# Patient Record
Sex: Female | Born: 1975 | Race: Black or African American | Hispanic: No | Marital: Single | Smoking: Never smoker
Health system: Southern US, Community
[De-identification: ages and names within clinical notes are randomized; demographics above are authoritative.]

## PROBLEM LIST (undated history)

## (undated) HISTORY — PX: ANKLE SURGERY: SHX546

---

## 2013-08-14 ENCOUNTER — Encounter (HOSPITAL_COMMUNITY): Payer: Self-pay | Admitting: Emergency Medicine

## 2013-08-14 ENCOUNTER — Emergency Department (HOSPITAL_COMMUNITY): Payer: Self-pay

## 2013-08-14 ENCOUNTER — Emergency Department (HOSPITAL_COMMUNITY)
Admission: EM | Admit: 2013-08-14 | Discharge: 2013-08-14 | Disposition: A | Discharge status: Home / Self Care | Payer: Self-pay | Attending: Emergency Medicine | Admitting: Emergency Medicine

## 2013-08-14 DIAGNOSIS — R0602 Shortness of breath: Secondary | ICD-10-CM | POA: Insufficient documentation

## 2013-08-14 DIAGNOSIS — R0789 Other chest pain: Secondary | ICD-10-CM

## 2013-08-14 DIAGNOSIS — R079 Chest pain, unspecified: Secondary | ICD-10-CM | POA: Insufficient documentation

## 2013-08-14 LAB — BASIC METABOLIC PANEL
Anion gap: 14 (ref 5–15)
BUN: 8 mg/dL (ref 6–23)
CO2: 20 mEq/L (ref 19–32)
CREATININE: 0.76 mg/dL (ref 0.50–1.10)
Calcium: 8.9 mg/dL (ref 8.4–10.5)
Chloride: 107 mEq/L (ref 96–112)
GFR calc Af Amer: >90 mL/min (ref >90)
GFR calc non Af Amer: >90 mL/min (ref >90)
Glucose, Bld: 116 mg/dL — ABNORMAL HIGH (ref 70–99)
Potassium: 3.4 mEq/L — ABNORMAL LOW (ref 3.7–5.3)
Sodium: 141 mEq/L (ref 137–147)

## 2013-08-14 LAB — CBC
HEMATOCRIT: 30.5 % — AB (ref 36.0–46.0)
Hemoglobin: 9.2 g/dL — ABNORMAL LOW (ref 12.0–15.0)
MCH: 20.6 pg — ABNORMAL LOW (ref 26.0–34.0)
MCHC: 30.2 g/dL (ref 30.0–36.0)
MCV: 68.4 fL — AB (ref 78.0–100.0)
Platelets: 402 10*3/uL — ABNORMAL HIGH (ref 150–400)
RBC: 4.46 MIL/uL (ref 3.87–5.11)
RDW: 17.6 % — ABNORMAL HIGH (ref 11.5–15.5)
WBC: 8.4 10*3/uL (ref 4.0–10.5)

## 2013-08-14 LAB — I-STAT TROPONIN, ED
Troponin i, poc: 0 ng/mL (ref 0.00–0.08)
Troponin i, poc: 0 ng/mL (ref 0.00–0.08)

## 2013-08-14 LAB — HEPATIC FUNCTION PANEL
ALBUMIN: 3.6 g/dL (ref 3.5–5.2)
ALK PHOS: 51 U/L (ref 39–117)
ALT: 9 U/L (ref 0–35)
AST: 14 U/L (ref 0–37)
Bilirubin, Direct: <0.2 mg/dL (ref 0.0–0.3)
TOTAL PROTEIN: 7.1 g/dL (ref 6.0–8.3)
Total Bilirubin: 0.3 mg/dL (ref 0.3–1.2)

## 2013-08-14 LAB — LIPASE, BLOOD: Lipase: 46 U/L (ref 11–59)

## 2013-08-14 MED ORDER — SUCRALFATE 1 G PO TABS: 1.0000 g | ORAL_TABLET | Freq: Four times a day (QID) | ORAL | Status: DC

## 2013-08-14 MED ORDER — FENTANYL CITRATE 0.05 MG/ML IJ SOLN
25.0000 ug | Freq: Once | INTRAMUSCULAR | Status: AC
  Administered 2013-08-14: 25 ug via INTRAVENOUS
  Filled 2013-08-14: qty 2

## 2013-08-14 MED ORDER — SODIUM CHLORIDE 0.9 % IV SOLN
INTRAVENOUS | Status: DC
  Administered 2013-08-14: 19:00:00 via INTRAVENOUS

## 2013-08-14 MED ORDER — OMEPRAZOLE 20 MG PO CPDR: 20.0000 mg | DELAYED_RELEASE_CAPSULE | Freq: Every day | ORAL | Status: DC

## 2013-08-14 MED ORDER — FAMOTIDINE IN NACL 20-0.9 MG/50ML-% IV SOLN
20.0000 mg | Freq: Once | INTRAVENOUS | Status: AC
  Administered 2013-08-14: 20 mg via INTRAVENOUS
  Filled 2013-08-14: qty 50

## 2013-08-14 MED ORDER — LIDOCAINE VISCOUS 2 % MT SOLN
15.0000 mL | Freq: Once | OROMUCOSAL | Status: AC
  Administered 2013-08-14: 15 mL via OROMUCOSAL
  Filled 2013-08-14: qty 15

## 2013-08-14 MED ORDER — GI COCKTAIL ~~LOC~~
30.0000 mL | Freq: Once | ORAL | Status: AC
  Administered 2013-08-14: 30 mL via ORAL
  Filled 2013-08-14: qty 30

## 2013-08-14 NOTE — ED Notes (Signed)
Pt. Reports midsternal chest pain x2 weeks with SOB. Lung sounds clear, patient does not appear to be in distress. Denies N/V, dizziness.

## 2013-08-14 NOTE — ED Notes (Signed)
Pt reports midsternal chest pain for the past 2 weeks. Reports some SOB with the pain. Thinks that she may have PNA

## 2013-08-14 NOTE — Discharge Instructions (Signed)
As discussed, your evaluation today has been largely reassuring.  But, it is important that you monitor your condition carefully, and do not hesitate to return to the ED if you develop new, or concerning changes in your condition.  Your pain is likely due to irritation of the stomach and esophagus.  Otherwise, please follow-up with your physician for appropriate ongoing care.   Chest Pain (Nonspecific) It is often hard to give a diagnosis for the cause of chest pain. There is always a chance that your pain could be related to something serious, such as a heart attack or a blood clot in the lungs. You need to follow up with your doctor. HOME CARE  If antibiotic medicine was given, take it as directed by your doctor. Finish the medicine even if you start to feel better.  For the next few days, avoid activities that bring on chest pain. Continue physical activities as told by your doctor.  Do not use any tobacco products. This includes cigarettes, chewing tobacco, and e-cigarettes.  Avoid drinking alcohol.  Only take medicine as told by your doctor.  Follow your doctor's suggestions for more testing if your chest pain does not go away.  Keep all doctor visits you made. GET HELP IF:  Your chest pain does not go away, even after treatment.  You have a rash with blisters on your chest.  You have a fever. GET HELP RIGHT AWAY IF:   You have more pain or pain that spreads to your arm, neck, jaw, back, or belly (abdomen).  You have shortness of breath.  You cough more than usual or cough up blood.  You have very bad back or belly pain.  You feel sick to your stomach (nauseous) or throw up (vomit).  You have very bad weakness.  You pass out (faint).  You have chills. This is an emergency. Do not wait to see if the problems will go away. Call your local emergency services (911 in [place] Do not drive yourself to the hospital. MAKE SURE YOU:   Understand these  instructions.  Will watch your condition.  Will get help right away if you are not doing well or get worse. Document Released: 07/16/2007 Document Revised: 02/01/2013 Document Reviewed: 07/16/2007 [facility] Patient Information 2015 [employer], [place]. This information is not intended to replace advice given to you by your health care provider. Make sure you discuss any questions you have with your health care provider.

## 2013-08-14 NOTE — ED Provider Notes (Signed)
CSN: [id number]   [id number]  Arrival date & time 08/14/13  1619 History   First MD Initiated Contact with Patient 08/14/13 1805     Chief Complaint  Patient presents with  . Chest Pain     (Consider location/radiation/quality/duration/timing/severity/associated sxs/prior Treatment) HPI Patient presents with weeks of ongoing chest pain. The pain is focally about the epigastrium, with radiation towards the sternum, and occasionally towards the right upper quadrant. No clear precipitant, and since onset no clear alleviating or exacerbating factors.,  And in the pain is sore, severe, with no associated vomiting, diarrhea, anorexia. There is mild associated cough, worse at night. Patient is generally well, does not smoke, does not drink  History reviewed. No pertinent past medical history. Past Surgical History  Procedure Laterality Date  . Ankle surgery     History reviewed. No pertinent family history. History  Substance Use Topics  . Smoking status: Never Smoker   . Smokeless tobacco: Not on file  . Alcohol Use: No   OB History   Grav Para Term Preterm Abortions TAB SAB Ect Mult Living                 Review of Systems  Constitutional:       Per HPI, otherwise negative  HENT:       Per HPI, otherwise negative  Respiratory:       Per HPI, otherwise negative  Cardiovascular:       Per HPI, otherwise negative  Gastrointestinal: Negative for vomiting.  Endocrine:       Negative aside from HPI  Genitourinary:       Neg aside from HPI   Musculoskeletal:       Per HPI, otherwise negative  Skin: Negative.   Neurological: Negative for syncope.      Allergies  Acetaminophen  Home Medications   Prior to Admission medications   Not on File   BP 125/79  Pulse 66  Temp(Src) 98.6 F (37 C) (Oral)  Resp 18  Ht 5\' 2"  (1.575 m)  Wt 210 lb (95.255 kg)  BMI 38.40 kg/m2  SpO2 100%  LMP 08/07/2013 Physical Exam  Nursing note and vitals reviewed. Constitutional: She is  oriented to person, place, and time. She appears well-developed and well-nourished. No distress.  HENT:  Head: Normocephalic and atraumatic.  Eyes: Conjunctivae and EOM are normal.  Cardiovascular: Normal rate and regular rhythm.   Pulmonary/Chest: Effort normal and breath sounds normal. No stridor. No respiratory distress.  Abdominal: She exhibits no distension. There is tenderness. There is no rebound.  Musculoskeletal: She exhibits no edema.  Pain about the epigastrium and sternum with pressure, no guarding, rebound of the abdominal exam.  Neurological: She is alert and oriented to person, place, and time. No cranial nerve deficit.  Skin: Skin is warm and dry.  Psychiatric: She has a normal mood and affect.    ED Course  Procedures (including critical care time) Labs Review Labs Reviewed  CBC - Abnormal; Notable for the following:    Hemoglobin 9.2 (*)    HCT 30.5 (*)    MCV 68.4 (*)    MCH 20.6 (*)    RDW 17.6 (*)    Platelets 402 (*)    All other components within normal limits  BASIC METABOLIC PANEL - Abnormal; Notable for the following:    Potassium 3.4 (*)    Glucose, Bld 116 (*)    All other components within normal limits  HEPATIC FUNCTION PANEL  LIPASE, BLOOD  Rosezena SensorI-STAT TROPOININ, ED    Imaging Review Dg Chest 2 View  08/14/2013   CLINICAL DATA:  Substernal chest pain and pressure for the past 2 weeks. Cough and chest congestion.  EXAM: CHEST  2 VIEW  COMPARISON:  None.  FINDINGS: Normal sized heart.  Clear lungs.  Normal appearing bones.  IMPRESSION: Normal examination.   Electronically Signed   By: Gordan PaymentSteve  Reid M.D.   On: 08/14/2013 17:26     EKG Interpretation   Date/Time:  Sunday August 14 2013 16:30:40 EDT Ventricular Rate:  85 PR Interval:  154 QRS Duration: 84 QT Interval:  378 QTC Calculation: 449 R Axis:   42 Text Interpretation:  Normal sinus rhythm Normal ECG Sinus rhythm Artifact  Borderline ECG Confirmed by Gerhard MunchLOCKWOOD, Talicia Sui  MD ([id number]) on 08/14/2013   6:12:57 PM     On re-exam the patient is awake and alert - no distress.   Second troponin is normal.  MDM   Patient presents with weeks of ongoing epigastric and chest pain.  Exam patient is awake alert, afebrile, with no hypoxia, tachypnea, tachycardia suggestive of thromboembolic processes are occult infection. Patient has 2 negative troponins, a nonischemic EKG, and given her improvement with GI cocktail, was started on a medication regimen for likely gastroesophageal origin.   Gerhard Munchobert Hiep Ollis, MD 08/14/13 2225

## 2014-07-23 ENCOUNTER — Encounter (HOSPITAL_COMMUNITY): Payer: Self-pay

## 2014-07-23 ENCOUNTER — Emergency Department (HOSPITAL_COMMUNITY)
Admission: EM | Admit: 2014-07-23 | Discharge: 2014-07-23 | Disposition: A | Discharge status: Home / Self Care | Payer: PRIVATE HEALTH INSURANCE | Attending: Emergency Medicine | Admitting: Emergency Medicine

## 2014-07-23 DIAGNOSIS — Z3202 Encounter for pregnancy test, result negative: Secondary | ICD-10-CM | POA: Diagnosis not present

## 2014-07-23 DIAGNOSIS — M62838 Other muscle spasm: Secondary | ICD-10-CM | POA: Diagnosis not present

## 2014-07-23 DIAGNOSIS — R109 Unspecified abdominal pain: Secondary | ICD-10-CM | POA: Insufficient documentation

## 2014-07-23 DIAGNOSIS — M545 Low back pain, unspecified: Secondary | ICD-10-CM

## 2014-07-23 LAB — URINALYSIS, ROUTINE W REFLEX MICROSCOPIC
Bilirubin Urine: NEGATIVE
Glucose, UA: NEGATIVE mg/dL
HGB URINE DIPSTICK: NEGATIVE
Ketones, ur: NEGATIVE mg/dL
LEUKOCYTES UA: NEGATIVE
Nitrite: NEGATIVE
PH: 6 (ref 5.0–8.0)
Protein, ur: NEGATIVE mg/dL
Specific Gravity, Urine: 1.024 (ref 1.005–1.030)
Urobilinogen, UA: 0.2 mg/dL (ref 0.0–1.0)

## 2014-07-23 LAB — POC URINE PREG, ED: PREG TEST UR: NEGATIVE

## 2014-07-23 MED ORDER — CYCLOBENZAPRINE HCL 10 MG PO TABS: 10.0000 mg | ORAL_TABLET | Freq: Two times a day (BID) | ORAL | Status: DC | PRN

## 2014-07-23 MED ORDER — OXYCODONE HCL 5 MG PO TABS: 5.0000 mg | ORAL_TABLET | Freq: Four times a day (QID) | ORAL | Status: DC | PRN

## 2014-07-23 MED ORDER — OXYCODONE HCL 5 MG PO TABS
5.0000 mg | ORAL_TABLET | Freq: Once | ORAL | Status: AC
  Administered 2014-07-23: 5 mg via ORAL
  Filled 2014-07-23: qty 1

## 2014-07-23 NOTE — ED Notes (Signed)
PA at bedside.

## 2014-07-23 NOTE — ED Notes (Addendum)
Pt reports 07-14-14 pt lifting boxes at work and pulled lower back. Pt has been seen several times and prescribed pain and muscle relaxers with no relief.  Pt reports now have right side abdominal pain.

## 2014-07-23 NOTE — ED Provider Notes (Signed)
CSN: [id number]     Arrival date & time 07/23/14  1218 History   First MD Initiated Contact with Patient 07/23/14 1448     Chief Complaint  Patient presents with  . Back Pain  . Abdominal Pain     (Consider location/radiation/quality/duration/timing/severity/associated sxs/prior Treatment) The history is provided by the patient and medical records. No language interpreter was used.     Kathleen Welch is a 39 y.o. female  with no major medical problems presents to the Emergency Department complaining of gradual, persistent, progressively worsening low back pain after lifting something heavy at work on 07/14/2014. Patient reports she has seen urgent care twice and was given Robaxin without relief. She reports she had x-rays taken there of her low back which showed no acute abnormalities. She reports that she is tired of hurting. She states she has continued to go to work and lift heavy things. She is additionally taking ibuprofen with moderate relief. She denies any numbness, tingling, weakness, gait disturbance, loss of bowel or bladder control, saddle anesthesia. Patient denies history of cancer, IVDU or drug use or anticoagulates.  Nursing reports patient complaining of right-sided abdominal pain. When questioned about this she reports that sometimes when she moves quickly the pain from her right lower back wraps around to her right side. She denies overt abdominal pain, nausea, vomiting, diarrhea, dysuria, hematuria, vaginal discharge, fever, chills.   History reviewed. No pertinent past medical history. Past Surgical History  Procedure Laterality Date  . Ankle surgery     History reviewed. No pertinent family history. History  Substance Use Topics  . Smoking status: Never Smoker   . Smokeless tobacco: Not on file  . Alcohol Use: No   OB History    No data available     Review of Systems  Constitutional: Negative for fever, diaphoresis, appetite change, fatigue and unexpected  weight change.  HENT: Negative for mouth sores.   Eyes: Negative for visual disturbance.  Respiratory: Negative for cough, chest tightness, shortness of breath and wheezing.   Cardiovascular: Negative for chest pain.  Gastrointestinal: Negative for nausea, vomiting, abdominal pain, diarrhea and constipation.  Endocrine: Negative for polydipsia, polyphagia and polyuria.  Genitourinary: Negative for dysuria, urgency, frequency and hematuria.  Musculoskeletal: Positive for back pain. Negative for neck stiffness.  Skin: Negative for rash.  Allergic/Immunologic: Negative for immunocompromised state.  Neurological: Negative for syncope, light-headedness and headaches.  Hematological: Does not bruise/bleed easily.  Psychiatric/Behavioral: Negative for sleep disturbance. The patient is not nervous/anxious.       Allergies  Acetaminophen and Coconut oil  Home Medications   Prior to Admission medications   Medication Sig Start Date End Date Taking? Authorizing Provider  amoxicillin-clavulanate (AUGMENTIN) 875-125 MG per tablet Take 1 tablet by mouth 2 (two) times daily.  07/16/14 07/26/14 Yes Historical Provider, MD  ibuprofen (ADVIL,MOTRIN) 800 MG tablet Take 800 mg by mouth every 8 (eight) hours as needed for moderate pain.   Yes Historical Provider, MD  methocarbamol (ROBAXIN) 500 MG tablet Take 500 mg by mouth daily as needed for muscle spasms.   Yes Historical Provider, MD  cyclobenzaprine (FLEXERIL) 10 MG tablet Take 1 tablet (10 mg total) by mouth 2 (two) times daily as needed for muscle spasms. 07/23/14   Sarin Comunale, PA-C  omeprazole (PRILOSEC) 20 MG capsule Take 1 capsule (20 mg total) by mouth daily. Patient not taking: Reported on 07/23/2014 08/14/13   Gerhard Munch, MD  oxyCODONE (ROXICODONE) 5 MG immediate release tablet Take 1 tablet (  5 mg total) by mouth every 6 (six) hours as needed for severe pain. 07/23/14   Johnie Makki, PA-C  sucralfate (CARAFATE) 1 G tablet Take 1  tablet (1 g total) by mouth 4 (four) times daily. Patient not taking: Reported on 07/23/2014 08/14/13   Gerhard Munch, MD   BP 110/70 mmHg  Pulse 62  Temp(Src) 98.2 F (36.8 C) (Oral)  Resp 16  Ht  (1.575 m)  Wt 222 lb 11.2 oz (101.016 kg)  BMI 40.72 kg/m2  SpO2 100%  LMP 07/16/2014 Physical Exam  Constitutional: She appears well-developed and well-nourished. No distress.  Awake, alert, nontoxic appearance  HENT:  Head: Normocephalic and atraumatic.  Mouth/Throat: Oropharynx is clear and moist. No oropharyngeal exudate.  Eyes: Conjunctivae are normal. No scleral icterus.  Neck: Normal range of motion. Neck supple.  Full ROM without pain  Cardiovascular: Normal rate, regular rhythm, normal heart sounds and intact distal pulses.   Pulmonary/Chest: Effort normal and breath sounds normal. No respiratory distress. She has no wheezes.  Equal chest expansion  Abdominal: Soft. Bowel sounds are normal. She exhibits no distension and no mass. There is no tenderness. There is no rebound and no guarding.  Soft and nontender No CVA tenderness  Musculoskeletal: Normal range of motion. She exhibits no edema.  Full range of motion of the T-spine and L-spine No tenderness to palpation of the spinous processes of the T-spine or L-spine Mild tenderness to palpation of the right paraspinous muscles of the L-spine Palpable muscle spasm of left trapezius with full ROM of all major joints  Lymphadenopathy:    She has no cervical adenopathy.  Neurological: She is alert. She has normal reflexes.  Reflex Scores:      Bicep reflexes are 2+ on the right side and 2+ on the left side.      Brachioradialis reflexes are 2+ on the right side and 2+ on the left side.      Patellar reflexes are 2+ on the right side and 2+ on the left side.      Achilles reflexes are 2+ on the right side and 2+ on the left side. Speech is clear and goal oriented, follows commands Normal 5/5 strength in upper and lower  extremities bilaterally including dorsiflexion and plantar flexion, strong and equal grip strength Sensation normal to light and sharp touch Moves extremities without ataxia, coordination intact Normal gait Normal balance No Clonus   Skin: Skin is warm and dry. No rash noted. She is not diaphoretic. No erythema.  Psychiatric: She has a normal mood and affect. Her behavior is normal.  Nursing note and vitals reviewed.   ED Course  Procedures (including critical care time) Labs Review Labs Reviewed  URINALYSIS, ROUTINE W REFLEX MICROSCOPIC (NOT AT [facility]) - Abnormal; Notable for the following:    APPearance CLOUDY (*)    All other components within normal limits  POC URINE PREG, ED    Imaging Review No results found.   EKG Interpretation None      MDM   Final diagnoses:  Right-sided low back pain without sciatica  Trapezius muscle spasm   Kathleen Welch presents with c/o low back pain and left trapezius pain after straining her back.  Pt isn't no apparent distress and moves easily around the room during exam and spontaneously.  Abdomen soft and nontender.  Due to history and physical highly doubt appendicitis, bowel obstruction, bowel perforation, cholecystitis, diverticulitis, PID or ectopic pregnancy.    UA without Hematuria to suggest  nephrolithiasis. She has no CVA tenderness.  No evidence of urinary tract infection on UA.  No neurological deficits and normal neuro exam.  Patient can walk with ease.  No loss of bowel or bladder control.  No concern for cauda equina.  No fever, night sweats, weight loss, h/o cancer, IVDU.  RICE protocol and pain medicine indicated and discussed with patient. Patient is to work on light duty for the next week.     BP 110/70 mmHg  Pulse 62  Temp(Src) 98.2 F (36.8 C) (Oral)  Resp 16  Ht 5\' 2"  (1.575 m)  Wt 222 lb 11.2 oz (101.016 kg)  BMI 40.72 kg/m2  SpO2 100%  LMP 07/16/2014      Dierdre Forth, PA-C 07/23/14  1645  Gwyneth Sprout, MD 07/27/14 [phone]

## 2014-07-23 NOTE — Discharge Instructions (Signed)
1. Medications: flexeril, ibuprofen, vicodin, usual home medications 2. Treatment: rest, drink plenty of fluids, gentle stretching as discussed, alternate ice and heat 3. Follow Up: Please followup with your primary doctor in 3 days for discussion of your diagnoses and further evaluation after today's visit; if you do not have a primary care doctor use the resource guide provided to find one;  Return to the ER for worsening back pain, difficulty walking, loss of bowel or bladder control or other concerning symptoms     Back Exercises Back exercises help treat and prevent back injuries. The goal of back exercises is to increase the strength of your abdominal and back muscles and the flexibility of your back. These exercises should be started when you no longer have back pain. Back exercises include:  Pelvic Tilt. Lie on your back with your knees bent. Tilt your pelvis until the lower part of your back is against the floor. Hold this position 5 to 10 sec and repeat 5 to 10 times.  Knee to Chest. Pull first 1 knee up against your chest and hold for 20 to 30 seconds, repeat this with the other knee, and then both knees. This may be done with the other leg straight or bent, whichever feels better.  Sit-Ups or Curl-Ups. Bend your knees 90 degrees. Start with tilting your pelvis, and do a partial, slow sit-up, lifting your trunk only 30 to 45 degrees off the floor. Take at least 2 to 3 seconds for each sit-up. Do not do sit-ups with your knees out straight. If partial sit-ups are difficult, simply do the above but with only tightening your abdominal muscles and holding it as directed.  Hip-Lift. Lie on your back with your knees flexed 90 degrees. Push down with your feet and shoulders as you raise your hips a couple inches off the floor; hold for 10 seconds, repeat 5 to 10 times.  Back arches. Lie on your stomach, propping yourself up on bent elbows. Slowly press on your hands, causing an arch in your low  back. Repeat 3 to 5 times. Any initial stiffness and discomfort should lessen with repetition over time.  Shoulder-Lifts. Lie face down with arms beside your body. Keep hips and torso pressed to floor as you slowly lift your head and shoulders off the floor. Do not overdo your exercises, especially in the beginning. Exercises may cause you some mild back discomfort which lasts for a few minutes; however, if the pain is more severe, or lasts for more than 15 minutes, do not continue exercises until you see your caregiver. Improvement with exercise therapy for back problems is slow.  See your caregivers for assistance with developing a proper back exercise program. Document Released: 03/06/2004 Document Revised: 04/21/2011 Document Reviewed: 11/28/2010 [facility] Patient Information 2015 [place], [place]. This information is not intended to replace advice given to you by your health care provider. Make sure you discuss any questions you have with your health care provider.    Emergency Department Resource Guide 1) Find a Doctor and Pay Out of Pocket Although you won't have to find out who is covered by your insurance plan, it is a good idea to ask around and get recommendations. You will then need to call the office and see if the doctor you have chosen will accept you as a new patient and what types of options they offer for patients who are self-pay. Some doctors offer discounts or will set up payment plans for their patients who do not  have insurance, but you will need to ask so you aren't surprised when you get to your appointment. ° °2) Contact Your Local Health Department °Not all health departments have doctors that can see patients for sick visits, but many do, so it is worth a call to see if yours does. If you don't know where your local health department is, you can check in your phone book. The CDC also has a tool to help you locate your state's health department, and many state websites also have  listings of all of their local health departments. ° °3) Find a Walk-in Clinic °If your illness is not likely to be very severe or complicated, you may want to try a walk in clinic. These are popping up all over the country in pharmacies, drugstores, and shopping centers. They're usually staffed by nurse practitioners or physician assistants that have been trained to treat common illnesses and complaints. They're usually fairly quick and inexpensive. However, if you have serious medical issues or chronic medical problems, these are probably not your best option. ° °No Primary Care Doctor: °- Call Health Connect at  832-8000 - they can help you locate a primary care doctor that  accepts your insurance, provides certain services, etc. °- Physician Referral Service- 1-800-533-3463 ° °Chronic Pain Problems: °Organization         Address  Phone   Notes  °[facility]  (336) 297-2271 Patients need to be referred by their primary care doctor.  ° °Medication Assistance: °Organization         Address  Phone   Notes  °[place] [facility] [address] °[place], [place] [address] (336) 641-8030 --Must be a resident of [place] °-- Must have NO insurance coverage whatsoever (no Medicaid/ Medicare, etc.) °-- The pt. MUST have a primary care doctor that directs their care regularly and follows them in the community °  °[facility]  (866) 331-1348   °[employer]  (888) 892-1162   ° °Agencies that provide inexpensive medical care: °Organization         Address  Phone   Notes  °[facility]  (336) 832-8035   °[facility]    (336) 832-7272   °[facility] [address] °[place], [place] [address] (336) 832-4777   °[facility] [address], °[place] (336) 271-4999   °[facility]    (336) 373-0678   °[facility]    (336) 272-1050   °[facility] ° [address], [place] Phone:  (336) 832-4444, Fax:  (336) 832-4440 Hours of Operation:  9 am - 6 pm, M-F.  Also accepts Medicaid/Medicare and self-pay.  °[facility] ° [address], [place] Phone: (336) 832-3150, Fax: (336) 832-3151. Hours of Operation:  8:30 am - 5:30 pm, M-F.  Also accepts Medicaid and self-pay.  °[facility] [place] [address], [place] Phone: (336) 878-6027   °[facility] [address], [place], [place] (336)723-1848, Ext. 123 Mondays & Thursdays: 7-9 AM.  First 15 patients are seen on a first come, first serve basis. °  ° °Medicaid-accepting [place] Providers: ° °Organization         Address  Phone   Notes  °[facility] [address], [place] (336) 641-2100 Also accepts self-pay patients.  °[facility] [address], [place] ° (336) 856-9996   °[facility] [address], [place] (336) 288-8857   °[facility] [address] [place] [address], [place] (336) 299-7000   °Veita Bland [address], [place]  ° (336) 373-1557 Only accepts [place] Access Medicaid patients after they have their name applied to their card.  ° °Self-Pay (no insurance) in [place]: ° °Organization         Address  Phone   Notes  °Sickle Cell Patients, [facility] [address], [place] (336) 832-1970   °[facility] [address], [place] (336) 832-4400   ° [facility] ° [address], [place] (336) 992-4800   °[facility]/Dr. Osei-Bonsu ° [address] [place] [address], [place] or [address], [place] (336) 841-8500 Phone number for both [place] and [place] locations is the same.  °[facility] [address], [place] (336) 299-0000   °[facility] [address] [place] [address],  [place] or [address] (336) 852-7530 °(336) 878-2260   °[facility] [address], [place] (336) 350-1642, phone; (336) 294-5005, fax Sees patients 1st and 3rd Saturday of every month.  Must not qualify for public or private insurance (i.e. Medicaid, Medicare, [place] Health Choice, Veterans' Benefits) • Household income should be no more than 200% of the poverty level •The clinic cannot treat you if you are pregnant or think you are pregnant • Sexually transmitted diseases are not treated at the clinic.  ° ° °Dental Care: °Organization         Address  Phone  Notes  °[place] [facility] [address],  (336) 641-6152 Accepts children up to age 21 who are enrolled in Medicaid or [place] Health Choice; pregnant women with a Medicaid card; and children who have applied for Medicaid or [place] Health Choice, but were declined, whose parents can pay a reduced fee at time of service.  °[place] [place]  [address], [place] (336) 641-7733 Accepts children up to age 21 who are enrolled in Medicaid or [place] Health Choice; pregnant women with a Medicaid card; and children who have applied for Medicaid or  Health Choice, but were declined, whose parents can pay a reduced fee at time of service.  °[facility] ° [address],  (336) 641-4533 Patients are seen by appointment only. Walk-ins are not accepted. [facility] will see patients 18 years of age and older. °Monday - Tuesday (8am-5pm) °Most Wednesdays (8:30-5pm) °$30 per visit, cash only  °[facility] ° [address], [place] (336) 641-4533 Patients are seen by appointment only. Walk-ins are not accepted. [facility] will see patients 18 years of age and older. °One Wednesday Evening (Monthly: Volunteer Based).  $30 per visit, cash only  °[facility]  (919) 537-3737 for adults; Children under age 4, call Graduate Pediatric Dentistry at (919) [phone]. Children aged 4-14, please call (919) [phone] to request a pediatric application. ° Dental services are provided in all areas of dental care including fillings, crowns and bridges, complete and partial dentures, implants, gum treatment, root canals, and extractions. Preventive care is also provided. Treatment is provided to both adults and children. °Patients are selected   via a lottery and there is often a waiting list.   [facility] [address], [place]  [phone] [email or web address]   [facility] [address] [place], [place] (706) [phone], Ext. 123 Second and Fourth Thursday of each month, opens at 6:30 AM; Clinic ends at 9 AM.  Patients are seen on a first-come first-served basis, and a limited number are seen during each clinic.   [facility]  [address] [place], [place] [phone]   Eligibility Requirements You must have lived in [place], [place], or [place] counties for at least the last three months.   You cannot be eligible for state or federal sponsored [employer], including [employer], [place], or [employer].   You generally cannot be eligible for healthcare insurance through your employer.    How to apply: Eligibility screenings are held every Tuesday and Wednesday afternoon from 1:00 pm until 4:00 pm. You do not need an appointment for the interview!  [facility] [address], [place], [place]   [place]  [place] Department  [place]  (343) [phone]    Behavioral Health Resources in the Community: Intensive Outpatient Programs Organization         Address  Phone  Notes  [facility] [address] [place], [place] (717) 669-0420     [facility] Outpatient [address] [place], [place]   [facility] [address] [place], [place]   [place] [address],  [place], [place] or 629-047-7133   Substance Abuse Resources Organization         Address  Phone  Notes  [facility]  919-180-0944   [place]  (504)412-7821   [facility]   [place]  608-128-5017   Residential & Outpatient Substance Abuse Program  229-661-3722   Psychological Services Organization         Address  Phone  Notes  [facility] [place]  [place]  339-016-8701   [place] [address], [place] or (640) 116-7793    Mobile Crisis Teams Organization         Address  Phone  Notes  [facility], Mobile Crisis Care Unit  (913)583-5610   [facility]  [address] [place], [place]   [place] [address], [place] 951-216-5316    Self-Help/Support Groups Organization         Address  Phone             Notes  [place]. of [place] - variety of support groups  [place] Call for more information  Narcotics Anonymous (NA), [facility] [address], [employer] [place]  2 meetings at this location   Special educational needs teacher         Address  Phone  Notes  [facility] [place],    [place]  1-(843)591-0072   [facility]  [address] [place] [address], [place], [place]   [place], [place] [phone] Admissions: 8am-3pm M-F  [facility] [place] [address],    [place], [place]   [address], [place], [place] 336-379-7146   °[facility] [address]  °[place],  [place] 336-285-9073   °[facility] Intensive Outpatient [address], [place], [place] 336-852-3033   °[facility] [address]  °[place], [place] 1-877-615-2722 or 336-784-9470   °[facility] [address] [place], [place] 336-227-7417 Accepts Medicaid  °[facility] [address]  ° [place] 1-800-659-3381 Substance Abuse/Addiction Treatment  ° °[place] Behavioral Health Resources °Organization         Address  Phone  Notes  °[facility]  (888) 581-9988   °Julie Brannon, PhD [address] [place], [place]   (336) 349-5553 or (336) 951-0000   °[facility]   [address] °[place], [place] (336) 349-4454   °[facility] [address], [place], [place] (336) 342-8316 Insurance/Medicaid/sponsorship through [facility]  °[facility] [address]                                    [place], [place] (336) 342-8316 Therapy/tele-psych/case  °[facility] [address]  ° [place], [place] (336) 349-2233    °Dr. Arfeen  (336) 349-4544   °[facility] [place]  [employer] [facility] [place] [facility] 1) [address], [place] °2) [address], [place] °3)  [address], [place] (336) 349-3220 °(336) 342-7768 ° °(336) 342-8140   °[place] Child Abuse Hotline (336) 342-1394 or (336) 342-3537 (After Hours)    ° ° ° °

## 2014-11-21 ENCOUNTER — Encounter (HOSPITAL_COMMUNITY): Payer: Self-pay | Admitting: *Deleted

## 2014-11-21 ENCOUNTER — Emergency Department (HOSPITAL_COMMUNITY): Payer: Medicaid Other

## 2014-11-21 ENCOUNTER — Emergency Department (HOSPITAL_COMMUNITY)
Admission: EM | Admit: 2014-11-21 | Discharge: 2014-11-21 | Disposition: A | Discharge status: Home / Self Care | Payer: Medicaid Other | Attending: Emergency Medicine | Admitting: Emergency Medicine

## 2014-11-21 DIAGNOSIS — R0789 Other chest pain: Secondary | ICD-10-CM | POA: Insufficient documentation

## 2014-11-21 DIAGNOSIS — Z791 Long term (current) use of non-steroidal anti-inflammatories (NSAID): Secondary | ICD-10-CM | POA: Diagnosis not present

## 2014-11-21 DIAGNOSIS — R0602 Shortness of breath: Secondary | ICD-10-CM | POA: Diagnosis not present

## 2014-11-21 DIAGNOSIS — R000 Tachycardia, unspecified: Secondary | ICD-10-CM | POA: Insufficient documentation

## 2014-11-21 DIAGNOSIS — R079 Chest pain, unspecified: Secondary | ICD-10-CM | POA: Diagnosis present

## 2014-11-21 LAB — BASIC METABOLIC PANEL
ANION GAP: 8 (ref 5–15)
BUN: 8 mg/dL (ref 6–20)
CALCIUM: 9.2 mg/dL (ref 8.9–10.3)
CO2: 21 mmol/L — ABNORMAL LOW (ref 22–32)
Chloride: 109 mmol/L (ref 101–111)
Creatinine, Ser: 0.8 mg/dL (ref 0.44–1.00)
GFR calc Af Amer: >60 mL/min (ref >60)
Glucose, Bld: 94 mg/dL (ref 65–99)
POTASSIUM: 3.6 mmol/L (ref 3.5–5.1)
SODIUM: 138 mmol/L (ref 135–145)

## 2014-11-21 LAB — CBC
HEMATOCRIT: 31 % — AB (ref 36.0–46.0)
HEMOGLOBIN: 9.2 g/dL — AB (ref 12.0–15.0)
MCH: 18.9 pg — ABNORMAL LOW (ref 26.0–34.0)
MCHC: 29.7 g/dL — AB (ref 30.0–36.0)
MCV: 63.7 fL — ABNORMAL LOW (ref 78.0–100.0)
Platelets: 357 10*3/uL (ref 150–400)
RBC: 4.87 MIL/uL (ref 3.87–5.11)
RDW: 18.8 % — AB (ref 11.5–15.5)
WBC: 7.3 10*3/uL (ref 4.0–10.5)

## 2014-11-21 LAB — I-STAT TROPONIN, ED: TROPONIN I, POC: 0 ng/mL (ref 0.00–0.08)

## 2014-11-21 MED ORDER — INDOMETHACIN 50 MG PO CAPS: 50.0000 mg | ORAL_CAPSULE | Freq: Two times a day (BID) | ORAL | Status: DC

## 2014-11-21 NOTE — ED Notes (Signed)
Pt upset at this time, states that "nobody cares and everyone keeps saying nothing is wrong when there is," this RN apologized that pt feels upset and offerred to write down cardiologist name and address from [employer]. Pt still upset as she ambulates out to the waiting room.

## 2014-11-21 NOTE — ED Notes (Addendum)
Pt came up to window infuriated that it is taking so long to be taken back to a room. I informed patient that we are trying our best to get her back to a room. Pt stated that her chest pain "doesn't mean shit".

## 2014-11-21 NOTE — ED Notes (Signed)
Pt is here with chest pain and hard to take deep and lump to chest.  Pt states this has been going on for 4 months.  Pt reports diarrhea for 4 days

## 2014-11-21 NOTE — ED Notes (Signed)
Pt came up infuriated that

## 2014-11-21 NOTE — ED Notes (Signed)
Pt placed on 5 lead cardiac monitoring. NSR w/ occasional pvc.

## 2014-11-21 NOTE — ED Provider Notes (Signed)
CSN: [id number]     Arrival date & time 11/21/14  1741 History  By signing my name below, I, Kathleen Welch, attest that this documentation has been prepared under the direction and in the presence of Felicie Morn, NP. Electronically Signed: Angelene Giovanni, ED Scribe. 11/21/2014. 10:50 PM.    Chief Complaint  Patient presents with  . Chest Pain   Patient is a 39 y.o. female presenting with chest pain. The history is provided by the patient. No language interpreter was used.  Chest Pain Pain location:  Substernal area Pain quality: sharp   Pain radiates to:  Does not radiate Pain radiates to the back: no   Pain severity:  Moderate Onset quality:  Sudden Duration: a year. Timing:  Intermittent Progression:  Unchanged Chronicity:  New Relieved by:  Nothing Worsened by:  Certain positions Ineffective treatments:  Antacids Associated symptoms: shortness of breath   Associated symptoms: no abdominal pain, no nausea and not vomiting   Risk factors: no birth control and no smoking    HPI Comments: Kathleen Welch is a 39 y.o. female who presents to the Emergency Department complaining of intermittent sudden onset of sharp substernal CP onset one year ago. She reports associated SOB and tachycardia only with the pain. She denies any abdominal pain, N/V/D, or heart burn. She states that she has been using Motrin with no relief. She denies that she is a current smoker or currently on birth control. She states that she has been in the hospital many times and been told that it is GERD but she states that she is not satisfied with that diagnosis. She reports a family hx of heart issues in her father.   PCP: [facility]. She states that she has not been able to follow up with PCP with this issue.   History reviewed. No pertinent past medical history. Past Surgical History  Procedure Laterality Date  . Ankle surgery     No family history on file. Social History   Substance Use Topics  . Smoking status: Never Smoker   . Smokeless tobacco: None  . Alcohol Use: No   OB History    No data available     Review of Systems  Respiratory: Positive for shortness of breath.   Cardiovascular: Positive for chest pain.  Gastrointestinal: Negative for nausea, vomiting, abdominal pain and diarrhea.  All other systems reviewed and are negative.     Allergies  Acetaminophen and Coconut oil  Home Medications   Prior to Admission medications   Medication Sig Start Date End Date Taking? Authorizing Provider  cyclobenzaprine (FLEXERIL) 10 MG tablet Take 1 tablet (10 mg total) by mouth 2 (two) times daily as needed for muscle spasms. 07/23/14   Hannah Muthersbaugh, PA-C  ibuprofen (ADVIL,MOTRIN) 800 MG tablet Take 800 mg by mouth every 8 (eight) hours as needed for moderate pain.    Historical Provider, MD  indomethacin (INDOCIN) 50 MG capsule Take 1 capsule (50 mg total) by mouth 2 (two) times daily with a meal. 11/21/14   Felicie Morn, NP  methocarbamol (ROBAXIN) 500 MG tablet Take 500 mg by mouth daily as needed for muscle spasms.    Historical Provider, MD  omeprazole (PRILOSEC) 20 MG capsule Take 1 capsule (20 mg total) by mouth daily. Patient not taking: Reported on 07/23/2014 08/14/13   Gerhard Munch, MD  oxyCODONE (ROXICODONE) 5 MG immediate release tablet Take 1 tablet (5 mg total) by mouth every 6 (six) hours as needed for  severe pain. 07/23/14   Hannah Muthersbaugh, PA-C  sucralfate (CARAFATE) 1 G tablet Take 1 tablet (1 g total) by mouth 4 (four) times daily. Patient not taking: Reported on 07/23/2014 08/14/13   Gerhard Munch, MD   BP 135/89 mmHg  Pulse 82  Temp(Src) 98.8 F (37.1 C) (Oral)  Resp 18  SpO2 100%  LMP 11/04/2014 Physical Exam  Constitutional: She is oriented to person, place, and time. She appears well-developed and well-nourished.  HENT:  Head: Normocephalic and atraumatic.  Cardiovascular: Normal rate.   Pulmonary/Chest:  Effort normal. She exhibits tenderness.  Reproducible chest wall tenderness in upper part of sternal into right chest wall   Abdominal: She exhibits no distension.  Neurological: She is alert and oriented to person, place, and time.  Skin: Skin is warm and dry.  Psychiatric: She has a normal mood and affect.  Nursing note and vitals reviewed.   ED Course  Procedures (including critical care time) DIAGNOSTIC STUDIES: Oxygen Saturation is 100% on RA, normal by my interpretation.    COORDINATION OF CARE: 10:44 PM- Pt advised of plan for treatment and pt agrees. Pt advised to see PCP about these symptoms for long term care.    Labs Review Labs Reviewed  BASIC METABOLIC PANEL - Abnormal; Notable for the following:    CO2 21 (*)    All other components within normal limits  CBC - Abnormal; Notable for the following:    Hemoglobin 9.2 (*)    HCT 31.0 (*)    MCV 63.7 (*)    MCH 18.9 (*)    MCHC 29.7 (*)    RDW 18.8 (*)    All other components within normal limits  I-STAT TROPOININ, ED  POC URINE PREG, ED    Imaging Review Dg Chest 2 View  11/21/2014   CLINICAL DATA:  Chest pain for 4 months  EXAM: CHEST - 2 VIEW  COMPARISON:  08/14/2013  FINDINGS: The heart size and mediastinal contours are within normal limits. Both lungs are clear. The visualized skeletal structures are unremarkable.  IMPRESSION: No active disease.   Electronically Signed   By: Alcide Clever M.D.   On: 11/21/2014 18:36   Felicie Morn, NP has personally reviewed and evaluated these images and lab results as part of his medical decision-making.   EKG Interpretation None     Patient with chronic, recurrent anterior chest wall pain. Chest wall tenderness to palpation upper chest wall to right of sternum. Multiple evaluations--does not appear to be cardiac or pulmonary in origin. Lab and radiology results reviewed. Chronic, stable anemia. Normal troponin. Normal CXR. ECG without indication of ischemia. No current  tachypnea, tachycardia, fever. Non-smoker and not on birth control. Doubt PE.  Will trial indocin for pain. Patient urged to follow-up with her PCP. Return precautions discussed. MDM   Final diagnoses:  Anterior chest wall pain    I personally performed the services described in this documentation, which was scribed in my presence. The recorded information has been reviewed and is accurate.     Felicie Morn, NP 11/22/14 1001  Blake Divine, MD 11/23/14 [phone]

## 2014-11-21 NOTE — Discharge Instructions (Signed)

## 2015-03-23 ENCOUNTER — Emergency Department (HOSPITAL_COMMUNITY)
Admission: EM | Admit: 2015-03-23 | Discharge: 2015-03-23 | Disposition: A | Discharge status: Home / Self Care | Payer: Medicaid Other | Attending: Emergency Medicine | Admitting: Emergency Medicine

## 2015-03-23 ENCOUNTER — Encounter (HOSPITAL_COMMUNITY): Payer: Self-pay | Admitting: *Deleted

## 2015-03-23 DIAGNOSIS — N390 Urinary tract infection, site not specified: Secondary | ICD-10-CM

## 2015-03-23 DIAGNOSIS — Z9889 Other specified postprocedural states: Secondary | ICD-10-CM | POA: Insufficient documentation

## 2015-03-23 DIAGNOSIS — Z3202 Encounter for pregnancy test, result negative: Secondary | ICD-10-CM | POA: Insufficient documentation

## 2015-03-23 LAB — URINALYSIS, ROUTINE W REFLEX MICROSCOPIC
Bilirubin Urine: NEGATIVE
GLUCOSE, UA: NEGATIVE mg/dL
Hgb urine dipstick: NEGATIVE
KETONES UR: NEGATIVE mg/dL
NITRITE: NEGATIVE
PROTEIN: NEGATIVE mg/dL
Specific Gravity, Urine: 1.024 (ref 1.005–1.030)
pH: 6 (ref 5.0–8.0)

## 2015-03-23 LAB — BASIC METABOLIC PANEL
Anion gap: 10 (ref 5–15)
BUN: 5 mg/dL — AB (ref 6–20)
CALCIUM: 9.4 mg/dL (ref 8.9–10.3)
CO2: 23 mmol/L (ref 22–32)
CREATININE: 0.81 mg/dL (ref 0.44–1.00)
Chloride: 108 mmol/L (ref 101–111)
GFR calc Af Amer: >60 mL/min (ref >60)
GLUCOSE: 105 mg/dL — AB (ref 65–99)
Potassium: 3.5 mmol/L (ref 3.5–5.1)
Sodium: 141 mmol/L (ref 135–145)

## 2015-03-23 LAB — CBC WITH DIFFERENTIAL/PLATELET
BASOS ABS: 0.1 10*3/uL (ref 0.0–0.1)
Basophils Relative: 1 %
EOS ABS: 0 10*3/uL (ref 0.0–0.7)
Eosinophils Relative: 0 %
HCT: 31.2 % — ABNORMAL LOW (ref 36.0–46.0)
HEMOGLOBIN: 8.8 g/dL — AB (ref 12.0–15.0)
LYMPHS PCT: 26 %
Lymphs Abs: 2.3 10*3/uL (ref 0.7–4.0)
MCH: 18.1 pg — AB (ref 26.0–34.0)
MCHC: 28.2 g/dL — AB (ref 30.0–36.0)
MCV: 64.3 fL — ABNORMAL LOW (ref 78.0–100.0)
MONO ABS: 0.4 10*3/uL (ref 0.1–1.0)
Monocytes Relative: 5 %
NEUTROS PCT: 68 %
Neutro Abs: 5.9 10*3/uL (ref 1.7–7.7)
PLATELETS: 576 10*3/uL — AB (ref 150–400)
RBC: 4.85 MIL/uL (ref 3.87–5.11)
RDW: 17.9 % — AB (ref 11.5–15.5)
WBC: 8.7 10*3/uL (ref 4.0–10.5)

## 2015-03-23 LAB — URINE MICROSCOPIC-ADD ON

## 2015-03-23 LAB — POC URINE PREG, ED: PREG TEST UR: NEGATIVE

## 2015-03-23 MED ORDER — CEPHALEXIN 500 MG PO CAPS: 500.0000 mg | ORAL_CAPSULE | Freq: Two times a day (BID) | ORAL | Status: AC

## 2015-03-23 MED ORDER — CEPHALEXIN 250 MG PO CAPS
500.0000 mg | ORAL_CAPSULE | Freq: Once | ORAL | Status: AC
  Administered 2015-03-23: 500 mg via ORAL
  Filled 2015-03-23: qty 2

## 2015-03-23 NOTE — ED Notes (Signed)
Pt defensive upon this RN and MD oni and Scribe entering the room. Pt ignoring the MD and continuing to stare at her phone. MD asked the pt a 2nd time as to "how can I help you." The pt responded "I mean I have to explain it in front of everyone?" This RN informed the pt that she did and that she was her Nurse and that the other lady was the Scribe and that she would be taking the Drs notes." Pt responded stating "tonight has been one of those nights. Everyone has had an attitude. What happened to my other Nurse." The pt was informed that The other Nurse was the secondary Nurse and she came in since this RN was upstairs with another pt." Pt informed that if she would like that Nurse to be her RN that would be fine." The MD asked the RN to step out and the pt responded "no she does not have to." This RN removed herself from the situation.

## 2015-03-23 NOTE — ED Notes (Signed)
Patient presents with c/o abd pain and dysuria since Saturday.  Denies vaginal discharge

## 2015-03-23 NOTE — ED Notes (Addendum)
Pt adamantly refusing to put on hospital gown. 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 This RN and Ian Malkin, a paramedic student came in to room to assess patient. When this RN asked what brought her to the ED and she stated "female problems...do I have to explain in front of a guy?" This RN asked patient if she felt more comfortable if Zach the paramedic student stepped out while she explained her symptoms and patient stated "no" and then she started to explain her symptoms. Pt then requested an ice pack for her hand from the blood draw and ice pack and two warm blankets provided to patient.

## 2015-03-23 NOTE — ED Provider Notes (Signed)
CSN: [id number]     Arrival date & time 03/23/15  0004 History  By signing my name below, I, Freida Busman, attest that this documentation has been prepared under the direction and in the presence of Tomasita Crumble, MD . Electronically Signed: Freida Busman, Scribe. 03/23/2015. 2:14 AM.     Chief Complaint  Patient presents with  . Abdominal Pain  . Dysuria    The history is provided by the patient. No language interpreter was used.   HPI Comments:  Kathleen Welch is a 40 y.o. female with a history of UTI, who presents to the Emergency Department complaining of cramping abdominal pain with associated dysuria x 1 week. She denies fever, vomiting, diarrhea, abnormal vaginal bleeding and vaginal discharge. She denies recent new sexual partners and recent unprotective sex. Pt has one female sexual partner. No alleviating factors noted.  History reviewed. No pertinent past medical history. Past Surgical History  Procedure Laterality Date  . Ankle surgery    . Cesarean section     No family history on file. Social History  Substance Use Topics  . Smoking status: Never Smoker   . Smokeless tobacco: Never Used  . Alcohol Use: Yes     Comment: ocassionally   OB History    No data available     Review of Systems 10 systems reviewed and all are negative for acute change except as noted in the HPI.   Allergies  Acetaminophen and Coconut oil  Home Medications   Prior to Admission medications   Medication Sig Start Date End Date Taking? Authorizing Provider  cyclobenzaprine (FLEXERIL) 10 MG tablet Take 1 tablet (10 mg total) by mouth 2 (two) times daily as needed for muscle spasms. Patient not taking: Reported on 03/23/2015 07/23/14   Dahlia Client Muthersbaugh, PA-C  indomethacin (INDOCIN) 50 MG capsule Take 1 capsule (50 mg total) by mouth 2 (two) times daily with a meal. Patient not taking: Reported on 03/23/2015 11/21/14   Felicie Morn, NP  omeprazole (PRILOSEC) 20 MG capsule Take 1 capsule  (20 mg total) by mouth daily. Patient not taking: Reported on 07/23/2014 08/14/13   Gerhard Munch, MD  oxyCODONE (ROXICODONE) 5 MG immediate release tablet Take 1 tablet (5 mg total) by mouth every 6 (six) hours as needed for severe pain. Patient not taking: Reported on 03/23/2015 07/23/14   Dahlia Client Muthersbaugh, PA-C  sucralfate (CARAFATE) 1 G tablet Take 1 tablet (1 g total) by mouth 4 (four) times daily. Patient not taking: Reported on 07/23/2014 08/14/13   Gerhard Munch, MD   BP 116/82 mmHg  Pulse 76  Temp(Src) 98.2 F (36.8 C) (Oral)  Resp 20  Ht  (1.575 m)  Wt 227 lb 8 oz (103.193 kg)  BMI 41.60 kg/m2  SpO2 99%  LMP 03/14/2015 Physical Exam  Constitutional: She is oriented to person, place, and time. She appears well-developed and well-nourished. No distress.  HENT:  Head: Normocephalic and atraumatic.  Nose: Nose normal.  Mouth/Throat: Oropharynx is clear and moist. No oropharyngeal exudate.  Eyes: Conjunctivae and EOM are normal. Pupils are equal, round, and reactive to light. No scleral icterus.  Neck: Normal range of motion. Neck supple. No JVD present. No tracheal deviation present. No thyromegaly present.  Cardiovascular: Normal rate, regular rhythm and normal heart sounds.  Exam reveals no gallop and no friction rub.   No murmur heard. Pulmonary/Chest: Effort normal and breath sounds normal. No respiratory distress. She has no wheezes. She exhibits no tenderness.  Abdominal: Soft. Bowel sounds  are normal. She exhibits no distension and no mass. There is no tenderness. There is no rebound and no guarding.  Musculoskeletal: Normal range of motion. She exhibits no edema or tenderness.  Lymphadenopathy:    She has no cervical adenopathy.  Neurological: She is alert and oriented to person, place, and time. No cranial nerve deficit. She exhibits normal muscle tone.  Skin: Skin is warm and dry. No rash noted. No erythema. No pallor.  Nursing note and vitals reviewed.   ED  Course  Procedures   DIAGNOSTIC STUDIES:  Oxygen Saturation is 99% on RA, normal by my interpretation.    COORDINATION OF CARE:  2:09 AM Pt updated with results.  Discussed treatment plan with pt at bedside and pt agreed to plan.  Labs Review Labs Reviewed  URINALYSIS, ROUTINE W REFLEX MICROSCOPIC (NOT AT [facility]) - Abnormal; Notable for the following:    APPearance CLOUDY (*)    Leukocytes, UA LARGE (*)    All other components within normal limits  CBC WITH DIFFERENTIAL/PLATELET - Abnormal; Notable for the following:    Hemoglobin 8.8 (*)    HCT 31.2 (*)    MCV 64.3 (*)    MCH 18.1 (*)    MCHC 28.2 (*)    RDW 17.9 (*)    Platelets 576 (*)    All other components within normal limits  BASIC METABOLIC PANEL - Abnormal; Notable for the following:    Glucose, Bld 105 (*)    BUN 5 (*)    All other components within normal limits  URINE MICROSCOPIC-ADD ON - Abnormal; Notable for the following:    Squamous Epithelial / LPF 6-30 (*)    Bacteria, UA MANY (*)    All other components within normal limits  POC URINE PREG, ED    Imaging Review No results found. I have personally reviewed and evaluated these images and lab results as part of my medical decision-making.    MDM   Final diagnoses:  None   Patient presents to the ED for abdominal pain and dysuria.  She denies vag bleeding or discharge.  UA reveals an infection.  Patient educated and given keflex for treatment.  PCP fu advised within 3 days.  She appears well and in NAD.  VS remain within her normal limits and she is safe for Dc.   I personally performed the services described in this documentation, which was scribed in my presence. The recorded information has been reviewed and is accurate.      Tomasita Crumble, MD 03/23/15 [phone]

## 2015-03-23 NOTE — Discharge Instructions (Signed)
Urinary Tract Infection Kathleen Welch, you were seen today for an infection in the urine.  Take antibiotics as directed for treatment.  You need to see a primary care doctor within 3 days for close follow up.  If symptoms worsen, come back to the ED immediately.  Thank you. A urinary tract infection (UTI) can occur any place along the urinary tract. The tract includes the kidneys, ureters, bladder, and urethra. A type of germ called bacteria often causes a UTI. UTIs are often helped with antibiotic medicine.  HOME CARE   If given, take antibiotics as told by your doctor. Finish them even if you start to feel better.  Drink enough fluids to keep your pee (urine) clear or pale yellow.  Avoid tea, drinks with caffeine, and bubbly (carbonated) drinks.  Pee often. Avoid holding your pee in for a long time.  Pee before and after having sex (intercourse).  Wipe from front to back after you poop (bowel movement) if you are a woman. Use each tissue only once. GET HELP RIGHT AWAY IF:   You have back pain.  You have lower belly (abdominal) pain.  You have chills.  You feel sick to your stomach (nauseous).  You throw up (vomit).  Your burning or discomfort with peeing does not go away.  You have a fever.  Your symptoms are not better in 3 days. MAKE SURE YOU:   Understand these instructions.  Will watch your condition.  Will get help right away if you are not doing well or get worse.   This information is not intended to replace advice given to you by your health care provider. Make sure you discuss any questions you have with your health care provider.   Document Released: 07/16/2007 Document Revised: 02/17/2014 Document Reviewed: 08/28/2011 Elsevier Interactive Patient Education [employer]

## 2015-06-28 ENCOUNTER — Encounter (HOSPITAL_BASED_OUTPATIENT_CLINIC_OR_DEPARTMENT_OTHER): Payer: Self-pay | Admitting: *Deleted

## 2015-06-28 ENCOUNTER — Emergency Department (HOSPITAL_BASED_OUTPATIENT_CLINIC_OR_DEPARTMENT_OTHER)
Admission: EM | Admit: 2015-06-28 | Discharge: 2015-06-28 | Disposition: A | Discharge status: Home / Self Care | Payer: Medicaid Other | Attending: Emergency Medicine | Admitting: Emergency Medicine

## 2015-06-28 DIAGNOSIS — B9789 Other viral agents as the cause of diseases classified elsewhere: Secondary | ICD-10-CM

## 2015-06-28 DIAGNOSIS — J069 Acute upper respiratory infection, unspecified: Secondary | ICD-10-CM | POA: Insufficient documentation

## 2015-06-28 LAB — RAPID STREP SCREEN (MED CTR MEBANE ONLY): Streptococcus, Group A Screen (Direct): NEGATIVE

## 2015-06-28 MED ORDER — GUAIFENESIN 100 MG/5ML PO LIQD
100.0000 mg | ORAL | Status: AC | PRN
Start: 2015-06-28 — End: ?

## 2015-06-28 NOTE — ED Notes (Signed)
Fever and cough x 3 days.  

## 2015-06-28 NOTE — ED Provider Notes (Signed)
CSN: [id number]     Arrival date & time 06/28/15  1732 History  By signing my name below, I, Kathleen Welch, attest that this documentation has been prepared under the direction and in the presence of Lyndal Pulleyaniel Yarrow Linhart, MD. Electronically Signed: Octavia HeirArianna Welch, ED Scribe. 06/28/2015. 6:17 PM.     Chief Complaint  Patient presents with  . Cough      The history is provided by the patient. No language interpreter was used.   HPI Comments: Kathleen ReekCrystal Welch is a 40 y.o. female who presents to the Emergency Department complaining of sudden onset, gradual worsening, moderate, cough onset 3 days ago. She reports having associated sore throat and fever (Tmax 101). It is unknown if she has taken any medication to alleviate her pain. No modifying or alleviating factors noted. Denies shortness of breath.  History reviewed. No pertinent past medical history. Past Surgical History  Procedure Laterality Date  . Ankle surgery    . Cesarean section     No family history on file. Social History  Substance Use Topics  . Smoking status: Never Smoker   . Smokeless tobacco: Never Used  . Alcohol Use: Yes     Comment: ocassionally   OB History    No data available     Review of Systems  Constitutional: Positive for fever.  HENT: Positive for sore throat.   Respiratory: Positive for cough. Negative for shortness of breath.   All other systems reviewed and are negative.     Allergies  Acetaminophen and Coconut oil  Home Medications   Prior to Admission medications   Medication Sig Start Date End Date Taking? Authorizing Provider  cephALEXin (KEFLEX) 500 MG capsule Take 1 capsule (500 mg total) by mouth 2 (two) times daily. 03/23/15   Tomasita CrumbleAdeleke Oni, MD   Triage vitals: BP 129/97 mmHg  Pulse 72  Temp(Src) 98.9 F (37.2 C) (Oral)  Resp 18  Ht 5\' 1"  (1.549 m)  Wt 227 lb (102.967 kg)  BMI 42.91 kg/m2  SpO2 100%  LMP 06/09/2015 Physical Exam  Constitutional: She is oriented to person, place,  and time. She appears well-developed and well-nourished.  HENT:  Head: Normocephalic.  Mouth/Throat: Oropharynx is clear and moist. No oropharyngeal exudate.  Eyes: EOM are normal.  Neck: Normal range of motion.  Cardiovascular: Normal rate, regular rhythm and normal heart sounds.   Pulmonary/Chest: Effort normal and breath sounds normal.  Abdominal: She exhibits no distension.  Musculoskeletal: Normal range of motion.  Neurological: She is alert and oriented to person, place, and time.  Psychiatric: She has a normal mood and affect.  Nursing note and vitals reviewed.   ED Course  Procedures  DIAGNOSTIC STUDIES: Oxygen Saturation is 100% on RA, normal by my interpretation.  COORDINATION OF CARE:  6:16 PM Discussed treatment plan which includes decongestants with pt at bedside and pt agreed to plan. Pt was advised to stay hydrated and to come back if symptoms worsen.  Labs Review Labs Reviewed  RAPID STREP SCREEN (NOT [facility])  CULTURE, GROUP A STREP [facility])    Imaging Review No results found. I have personally reviewed and evaluated these images and lab results as part of my medical decision-making.   EKG Interpretation None      MDM   Final diagnoses:  Viral URI with cough   No signs of respiratory distress, non-toxic appearing, CTAB, no concern for pneumonia with this clinical picture. No emergent testing indicated at this time. Pt discharged with likely viral cough  which will be self limited in its course. Recommended decongestants and expectorants. Plan to follow up with PCP as needed and return precautions discussed for worsening or new concerning symptoms.   I personally performed the services described in this documentation, which was scribed in my presence. The recorded information has been reviewed and is accurate.       Lyndal Pulley, MD 06/28/15 [phone]

## 2015-06-28 NOTE — Discharge Instructions (Signed)

## 2015-07-01 LAB — CULTURE, GROUP A STREP (THRC)

## 2015-07-07 ENCOUNTER — Emergency Department (HOSPITAL_BASED_OUTPATIENT_CLINIC_OR_DEPARTMENT_OTHER)
Admission: EM | Admit: 2015-07-07 | Discharge: 2015-07-07 | Disposition: A | Discharge status: Home / Self Care | Payer: Medicaid Other | Attending: Emergency Medicine | Admitting: Emergency Medicine

## 2015-07-07 ENCOUNTER — Encounter (HOSPITAL_BASED_OUTPATIENT_CLINIC_OR_DEPARTMENT_OTHER): Payer: Self-pay | Admitting: *Deleted

## 2015-07-07 DIAGNOSIS — H9209 Otalgia, unspecified ear: Secondary | ICD-10-CM | POA: Insufficient documentation

## 2015-07-07 DIAGNOSIS — J209 Acute bronchitis, unspecified: Secondary | ICD-10-CM | POA: Insufficient documentation

## 2015-07-07 DIAGNOSIS — J029 Acute pharyngitis, unspecified: Secondary | ICD-10-CM | POA: Insufficient documentation

## 2015-07-07 MED ORDER — GUAIFENESIN-CODEINE 100-10 MG/5ML PO SOLN: 10.0000 mL | Freq: Four times a day (QID) | ORAL | Status: AC | PRN

## 2015-07-07 MED ORDER — AZITHROMYCIN 250 MG PO TABS: ORAL_TABLET | ORAL | Status: AC

## 2015-07-07 NOTE — ED Notes (Addendum)
Pt c/o URi symptoms  x 2 weeks seen here last week for same

## 2015-07-07 NOTE — ED Provider Notes (Signed)
CSN: [id number]     Arrival date & time 07/07/15  0005 History   First MD Initiated Contact with Patient 07/07/15 0026     Chief Complaint  Patient presents with  . URI     (Consider location/radiation/quality/duration/timing/severity/associated sxs/prior Treatment) HPI Comments: Patient is a 40 year old female with no significant past medical history. She presents with complaints of a greater than 2 week history of persistent URI symptoms. She was seen here one week ago with similar complaints and was diagnosed with a viral upper respiratory infection. She has been adhering to the treatment recommended at that time, however she has had no improvement. She actually is worse. She denies any chest pain or difficulty breathing.  Patient is a 40 y.o. female presenting with URI. The history is provided by the patient.  URI Presenting symptoms: congestion, cough, ear pain, fatigue, fever, rhinorrhea and sore throat   Severity:  Moderate Duration:  16 days Timing:  Constant Progression:  Worsening Chronicity:  New Relieved by:  Nothing Worsened by:  Nothing tried Ineffective treatments:  OTC medications   History reviewed. No pertinent past medical history. Past Surgical History  Procedure Laterality Date  . Ankle surgery    . Cesarean section     History reviewed. No pertinent family history. Social History  Substance Use Topics  . Smoking status: Never Smoker   . Smokeless tobacco: Never Used  . Alcohol Use: Yes     Comment: ocassionally   OB History    No data available     Review of Systems  Constitutional: Positive for fever and fatigue.  HENT: Positive for congestion, ear pain, rhinorrhea and sore throat.   Respiratory: Positive for cough.   All other systems reviewed and are negative.     Allergies  Acetaminophen and Coconut oil  Home Medications   Prior to Admission medications   Medication Sig Start Date End Date Taking? Authorizing Provider  cephALEXin  (KEFLEX) 500 MG capsule Take 1 capsule (500 mg total) by mouth 2 (two) times daily. 03/23/15   Tomasita CrumbleAdeleke Oni, MD  guaiFENesin (ROBITUSSIN) 100 MG/5ML liquid Take 5-10 mLs (100-200 mg total) by mouth every 4 (four) hours as needed for cough. 06/28/15   Lyndal Pulleyaniel Knott, MD   BP 129/86 mmHg  Pulse 69  Temp(Src) 98.6 F (37 C)  Resp 16  Ht 5\' 2"  (1.575 m)  Wt 227 lb (102.967 kg)  BMI 41.51 kg/m2  SpO2 100%  LMP 07/07/2015 Physical Exam  Constitutional: She is oriented to person, place, and time. She appears well-developed and well-nourished. No distress.  HENT:  Head: Normocephalic and atraumatic.  Mouth/Throat: Oropharynx is clear and moist.  Neck: Normal range of motion. Neck supple.  Cardiovascular: Normal rate and regular rhythm.  Exam reveals no gallop and no friction rub.   No murmur heard. Pulmonary/Chest: Effort normal and breath sounds normal. No respiratory distress. She has no wheezes. She has no rales.  Abdominal: Soft. Bowel sounds are normal. She exhibits no distension. There is no tenderness.  Musculoskeletal: Normal range of motion. She exhibits no edema.  Neurological: She is alert and oriented to person, place, and time.  Skin: Skin is warm and dry. She is not diaphoretic.  Nursing note and vitals reviewed.   ED Course  Procedures (including critical care time) Labs Review Labs Reviewed - No data to display  Imaging Review No results found. I have personally reviewed and evaluated these images and lab results as part of my medical decision-making.  MDM   Final diagnoses:  None    Patient symptoms are persisting longer than what would be expected with a typical viral upper respiratory infection. She has had persistent symptoms for 2 weeks and are worsening. She will be treated for bronchitis with Zithromax and Robitussin with codeine.    Geoffery Lyons, MD 07/07/15 [phone]

## 2015-07-07 NOTE — Discharge Instructions (Signed)
Zithromax as prescribed.  Robitussin with codeine as prescribed as needed for cough.  Return to the emergency department if you develop severe chest pain or difficulty breathing.   Acute Bronchitis Bronchitis is inflammation of the airways that extend from the windpipe into the lungs (bronchi). The inflammation often causes mucus to develop. This leads to a cough, which is the most common symptom of bronchitis.  In acute bronchitis, the condition usually develops suddenly and goes away over time, usually in a couple weeks. Smoking, allergies, and asthma can make bronchitis worse. Repeated episodes of bronchitis may cause further lung problems.  CAUSES Acute bronchitis is most often caused by the same virus that causes a cold. The virus can spread from person to person (contagious) through coughing, sneezing, and touching contaminated objects. SIGNS AND SYMPTOMS   Cough.   Fever.   Coughing up mucus.   Body aches.   Chest congestion.   Chills.   Shortness of breath.   Sore throat.  DIAGNOSIS  Acute bronchitis is usually diagnosed through a physical exam. Your health care provider will also ask you questions about your medical history. Tests, such as chest X-rays, are sometimes done to rule out other conditions.  TREATMENT  Acute bronchitis usually goes away in a couple weeks. Oftentimes, no medical treatment is necessary. Medicines are sometimes given for relief of fever or cough. Antibiotic medicines are usually not needed but may be prescribed in certain situations. In some cases, an inhaler may be recommended to help reduce shortness of breath and control the cough. A cool mist vaporizer may also be used to help thin bronchial secretions and make it easier to clear the chest.  HOME CARE INSTRUCTIONS  Get plenty of rest.   Drink enough fluids to keep your urine clear or pale yellow (unless you have a medical condition that requires fluid restriction). Increasing fluids  may help thin your respiratory secretions (sputum) and reduce chest congestion, and it will prevent dehydration.   Take medicines only as directed by your health care provider.  If you were prescribed an antibiotic medicine, finish it all even if you start to feel better.  Avoid smoking and secondhand smoke. Exposure to cigarette smoke or irritating chemicals will make bronchitis worse. If you are a smoker, consider using nicotine gum or skin patches to help control withdrawal symptoms. Quitting smoking will help your lungs heal faster.   Reduce the chances of another bout of acute bronchitis by washing your hands frequently, avoiding people with cold symptoms, and trying not to touch your hands to your mouth, nose, or eyes.   Keep all follow-up visits as directed by your health care provider.  SEEK MEDICAL CARE IF: Your symptoms do not improve after 1 week of treatment.  SEEK IMMEDIATE MEDICAL CARE IF:  You develop an increased fever or chills.   You have chest pain.   You have severe shortness of breath.  You have bloody sputum.   You develop dehydration.  You faint or repeatedly feel like you are going to pass out.  You develop repeated vomiting.  You develop a severe headache. MAKE SURE YOU:   Understand these instructions.  Will watch your condition.  Will get help right away if you are not doing well or get worse.   This information is not intended to replace advice given to you by your health care provider. Make sure you discuss any questions you have with your health care provider.   Document Released: 03/06/2004 Document Revised:  02/17/2014 Document Reviewed: 07/20/2012 Elsevier Interactive Patient Education [employer].

## 2017-06-04 IMAGING — CR DG CHEST 2V
2 series · 2 of 2 positions shown · non-contrast
Comparison: 08/14/2013

CLINICAL DATA: Chest pain for 4 months

EXAM:
CHEST - 2 VIEW

[chest pa]
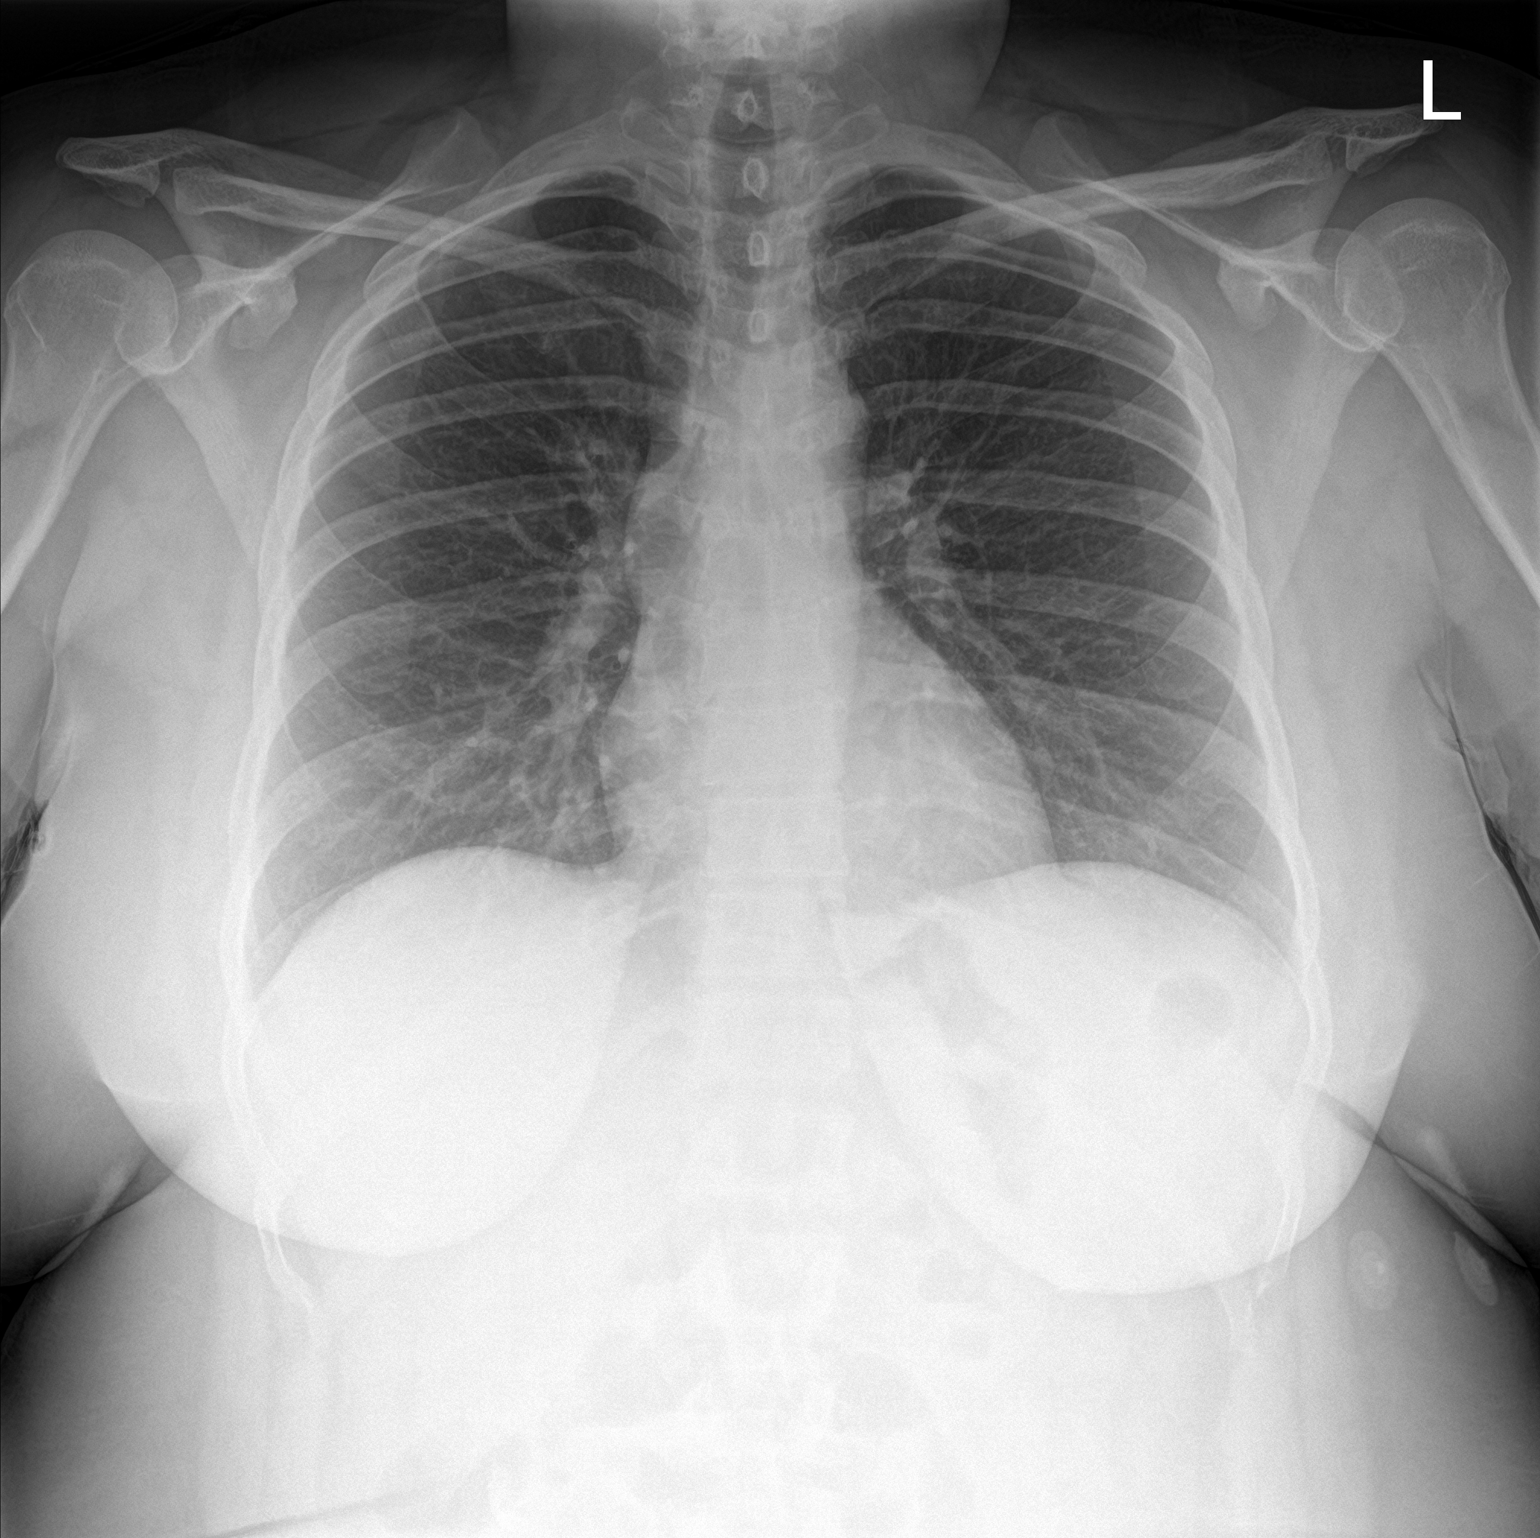

[chest lat]
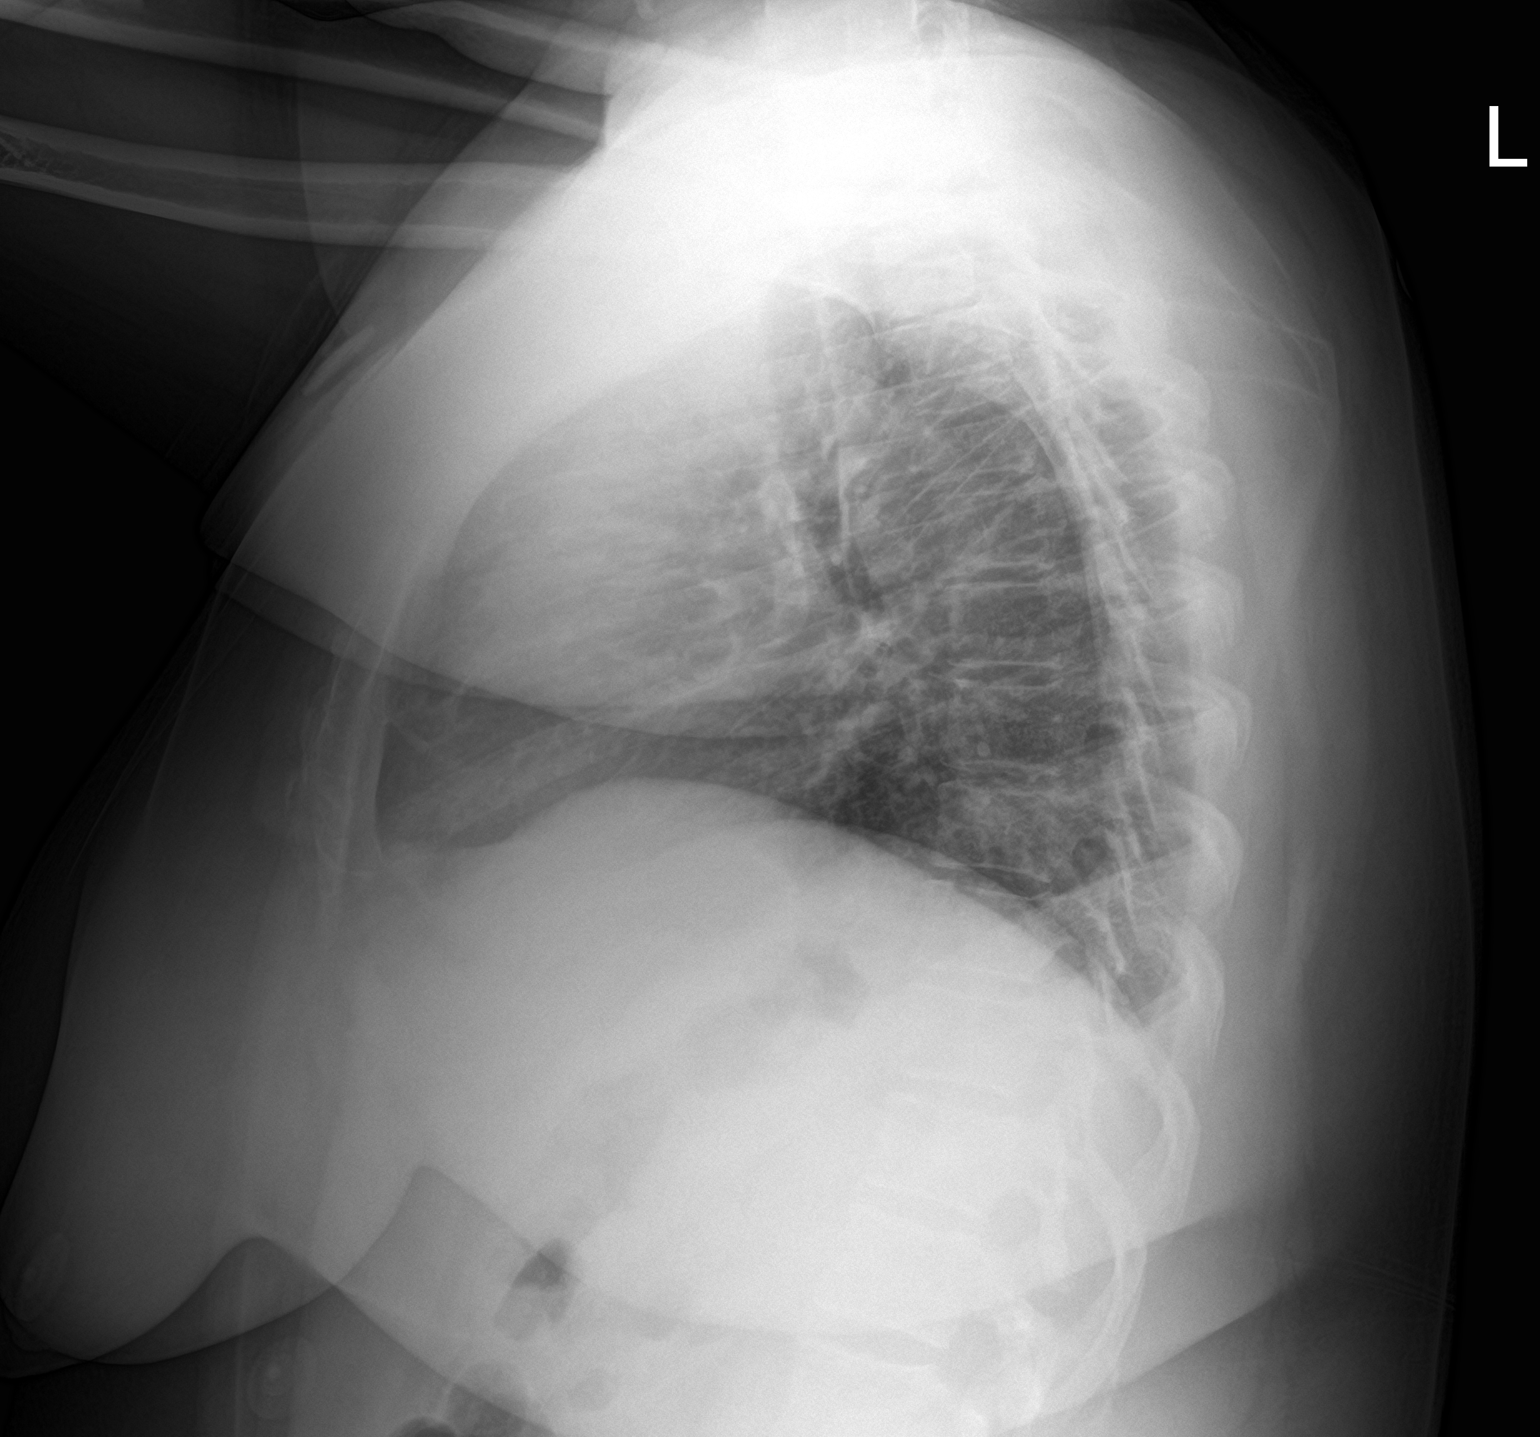

[2 of 2 positions shown; findings below may reference images not displayed]

FINDINGS: The heart size and mediastinal contours are within normal limits.
Both lungs are clear. The visualized skeletal structures are
unremarkable.
IMPRESSION: No active disease.
